# Patient Record
Sex: Male | Born: 1995 | Race: Black or African American | Hispanic: No | Marital: Single
Health system: Southern US, Community
[De-identification: ages and names within clinical notes are randomized; demographics above are authoritative.]

---

## 2020-01-16 ENCOUNTER — Other Ambulatory Visit: Payer: Self-pay

## 2020-01-16 ENCOUNTER — Emergency Department (HOSPITAL_COMMUNITY)
Admission: EM | Admit: 2020-01-16 | Discharge: 2020-01-16 | Disposition: A | Discharge status: Home / Self Care | Payer: Self-pay | Attending: Emergency Medicine | Admitting: Emergency Medicine

## 2020-01-16 ENCOUNTER — Emergency Department (HOSPITAL_COMMUNITY): Payer: Self-pay

## 2020-01-16 ENCOUNTER — Encounter (HOSPITAL_COMMUNITY): Payer: Self-pay | Admitting: Emergency Medicine

## 2020-01-16 DIAGNOSIS — Z20822 Contact with and (suspected) exposure to covid-19: Secondary | ICD-10-CM | POA: Insufficient documentation

## 2020-01-16 DIAGNOSIS — R1010 Upper abdominal pain, unspecified: Secondary | ICD-10-CM | POA: Insufficient documentation

## 2020-01-16 DIAGNOSIS — R112 Nausea with vomiting, unspecified: Secondary | ICD-10-CM | POA: Insufficient documentation

## 2020-01-16 LAB — COMPREHENSIVE METABOLIC PANEL
ALT: 25 U/L (ref 0–44)
AST: 30 U/L (ref 15–41)
Albumin: 5 g/dL (ref 3.5–5.0)
Alkaline Phosphatase: 53 U/L (ref 38–126)
Anion gap: 14 (ref 5–15)
BUN: 11 mg/dL (ref 6–20)
CO2: 21 mmol/L — ABNORMAL LOW (ref 22–32)
Calcium: 10.2 mg/dL (ref 8.9–10.3)
Chloride: 102 mmol/L (ref 98–111)
Creatinine, Ser: 1 mg/dL (ref 0.61–1.24)
GFR calc Af Amer: >60 mL/min (ref >60)
GFR calc non Af Amer: >60 mL/min (ref >60)
Glucose, Bld: 147 mg/dL — ABNORMAL HIGH (ref 70–99)
Potassium: 4 mmol/L (ref 3.5–5.1)
Sodium: 137 mmol/L (ref 135–145)
Total Bilirubin: 0.7 mg/dL (ref 0.3–1.2)
Total Protein: 7.9 g/dL (ref 6.5–8.1)

## 2020-01-16 LAB — CBC
HCT: 44.1 % (ref 39.0–52.0)
Hemoglobin: 14.6 g/dL (ref 13.0–17.0)
MCH: 28.5 pg (ref 26.0–34.0)
MCHC: 33.1 g/dL (ref 30.0–36.0)
MCV: 86.1 fL (ref 80.0–100.0)
Platelets: 164 10*3/uL (ref 150–400)
RBC: 5.12 MIL/uL (ref 4.22–5.81)
RDW: 13.2 % (ref 11.5–15.5)
WBC: 6.9 10*3/uL (ref 4.0–10.5)
nRBC: 0 % (ref 0.0–0.2)

## 2020-01-16 LAB — RESPIRATORY PANEL BY RT PCR (FLU A&B, COVID)
Influenza A by PCR: NEGATIVE
Influenza B by PCR: NEGATIVE
SARS Coronavirus 2 by RT PCR: NEGATIVE

## 2020-01-16 LAB — URINALYSIS, ROUTINE W REFLEX MICROSCOPIC
Bacteria, UA: NONE SEEN
Bilirubin Urine: NEGATIVE
Glucose, UA: NEGATIVE mg/dL
Hgb urine dipstick: NEGATIVE
Ketones, ur: 5 mg/dL — AB
Leukocytes,Ua: NEGATIVE
Nitrite: NEGATIVE
Protein, ur: 30 mg/dL — AB
Specific Gravity, Urine: 1.024 (ref 1.005–1.030)
pH: 8 (ref 5.0–8.0)

## 2020-01-16 LAB — LIPASE, BLOOD: Lipase: 25 U/L (ref 11–51)

## 2020-01-16 MED ORDER — FAMOTIDINE IN NACL 20-0.9 MG/50ML-% IV SOLN
20.0000 mg | Freq: Once | INTRAVENOUS | Status: AC
  Administered 2020-01-16: 20 mg via INTRAVENOUS
  Filled 2020-01-16: qty 50

## 2020-01-16 MED ORDER — IOHEXOL 300 MG/ML  SOLN
80.0000 mL | Freq: Once | INTRAMUSCULAR | Status: AC | PRN
  Administered 2020-01-16: 80 mL via INTRAVENOUS

## 2020-01-16 MED ORDER — ONDANSETRON 4 MG PO TBDP: 4.0000 mg | ORAL_TABLET | Freq: Three times a day (TID) | ORAL | 0 refills | Status: AC | PRN

## 2020-01-16 MED ORDER — SODIUM CHLORIDE 0.9 % IV BOLUS
1000.0000 mL | Freq: Once | INTRAVENOUS | Status: AC
  Administered 2020-01-16: 1000 mL via INTRAVENOUS

## 2020-01-16 MED ORDER — HALOPERIDOL LACTATE 5 MG/ML IJ SOLN
2.0000 mg | Freq: Once | INTRAMUSCULAR | Status: AC
  Administered 2020-01-16: 2 mg via INTRAVENOUS
  Filled 2020-01-16: qty 1

## 2020-01-16 MED ORDER — METOCLOPRAMIDE HCL 5 MG/ML IJ SOLN
10.0000 mg | Freq: Once | INTRAMUSCULAR | Status: AC
  Administered 2020-01-16: 10 mg via INTRAVENOUS
  Filled 2020-01-16: qty 2

## 2020-01-16 MED ORDER — LORAZEPAM 2 MG/ML IJ SOLN
1.0000 mg | Freq: Once | INTRAMUSCULAR | Status: AC
  Administered 2020-01-16: 1 mg via INTRAVENOUS
  Filled 2020-01-16: qty 1

## 2020-01-16 MED ORDER — ONDANSETRON HCL 4 MG/2ML IJ SOLN
4.0000 mg | Freq: Once | INTRAMUSCULAR | Status: AC
  Administered 2020-01-16: 4 mg via INTRAVENOUS
  Filled 2020-01-16: qty 2

## 2020-01-16 MED ORDER — ONDANSETRON 4 MG PO TBDP: 4.0000 mg | ORAL_TABLET | Freq: Three times a day (TID) | ORAL | 0 refills | Status: DC | PRN

## 2020-01-16 NOTE — Discharge Instructions (Addendum)
Take zofran as needed for nausea or vomiting.  Eat a bland diet to decrease symptoms.  Stop smoking marijuana, this is likely contributing to your nausea and vomiting. Follow-up with the GI doctor listed below as needed if you continue to have symptoms. Return to the emergency room if you develop high fevers, persistent vomiting, severe worsening abdominal pain, or any new, worsening, or concerning symptoms.  Your Covid test was negative.  You may return to work when your symptoms are improving.

## 2020-01-16 NOTE — ED Notes (Signed)
Pt nauseous and had small emesis episode.

## 2020-01-16 NOTE — ED Triage Notes (Signed)
Pt reports abd pain that began this morning, reports vomiting as well. States feeling hot and cold.

## 2020-01-16 NOTE — ED Provider Notes (Signed)
[facility] EMERGENCY DEPARTMENT Provider Note   CSN: [id number] Arrival date & time: 01/16/20  4259     History Chief Complaint  Patient presents with  . Abdominal Pain    Joe Patrick is a 24 y.o. male presented for evaluation nausea, vomiting, abdominal pain.  Patient states his symptoms began this morning.  He gradually developed worsening abdominal pain, mostly in the upper abdomen.  He reports associated nausea and vomiting, has thrown up "many" times.  He is unable to quantify.  No blood in his emesis.  He took Pepto-Bismol without improvement of symptoms.  He denies fevers, chills, chest pain, shortness breath, cough, urinary symptoms, normal bowel movements.  He denies history of similar.  No previous history of abdominal problems.  He has never had abdominal surgeries.  He has never seen GI.  He denies alcohol use.  He reports heavy/daily marijuana use.  He has never had issues with hyperemesis cannabinoid syndrome. He has no medical problems, takes no medications daily. No one else around him is sick.   HPI     History reviewed. No pertinent past medical history.  There are no problems to display for this patient.   History reviewed. No pertinent surgical history.     No family history on file.  Social History   Tobacco Use  . Smoking status: Not on file  Substance Use Topics  . Alcohol use: Not on file  . Drug use: Not on file    Home Medications Prior to Admission medications   Medication Sig Start Date End Date Taking? Authorizing Provider  ondansetron (ZOFRAN ODT) 4 MG disintegrating tablet Take 1 tablet (4 mg total) by mouth every 8 (eight) hours as needed for nausea or vomiting. 01/16/20   Birch Farino, PA-C    Allergies    Patient has no known allergies.  Review of Systems   Review of Systems  Gastrointestinal: Positive for abdominal pain, nausea and vomiting.  All other systems reviewed and are negative.   Physical  Exam Updated Vital Signs BP (!) 125/55 (BP Location: Left Arm)   Pulse 71   Temp 99.2 F (37.3 C) (Oral)   Resp 16   Ht 6' (1.829 m)   Wt 68 kg   SpO2 100%   BMI 20.34 kg/m   Physical Exam Vitals and nursing note reviewed.  Constitutional:      General: He is not in acute distress.    Appearance: He is well-developed.     Comments: Appears nontoxic  HENT:     Head: Normocephalic and atraumatic.  Eyes:     Conjunctiva/sclera: Conjunctivae normal.     Pupils: Pupils are equal, round, and reactive to light.  Cardiovascular:     Rate and Rhythm: Normal rate and regular rhythm.     Pulses: Normal pulses.  Pulmonary:     Effort: Pulmonary effort is normal. No respiratory distress.     Breath sounds: Normal breath sounds. No wheezing.  Abdominal:     General: There is no distension.     Palpations: Abdomen is soft. There is no mass.     Tenderness: There is abdominal tenderness. There is no guarding or rebound.     Comments: TTP of upper abdomen, worse in epigastric region.  No rigidity, guarding, distention.  Negative rebound.  No peritonitis.  Musculoskeletal:        General: Normal range of motion.     Cervical back: Normal range of motion and neck supple.  Skin:    General: Skin is warm and dry.     Capillary Refill: Capillary refill takes less than 2 seconds.  Neurological:     Mental Status: He is alert and oriented to person, place, and time.     ED Results / Procedures / Treatments   Labs (all labs ordered are listed, but only abnormal results are displayed) Labs Reviewed  COMPREHENSIVE METABOLIC PANEL - Abnormal; Notable for the following components:      Result Value   CO2 21 (*)    Glucose, Bld 147 (*)    All other components within normal limits  URINALYSIS, ROUTINE W REFLEX MICROSCOPIC - Abnormal; Notable for the following components:   Ketones, ur 5 (*)    Protein, ur 30 (*)    All other components within normal limits  RESPIRATORY PANEL BY RT PCR  (FLU A&B, COVID)  LIPASE, BLOOD  CBC    EKG EKG Interpretation  Date/Time:  Friday January 16 2020 14:10:34 EDT Ventricular Rate:  74 PR Interval:    QRS Duration: 93 QT Interval:  531 QTC Calculation: 590 R Axis:   92 Text Interpretation: Sinus rhythm Borderline short PR interval Borderline right axis deviation Prolonged QT interval Confirmed by Virgina Norfolk [phone]) on 01/16/2020 2:14:23 PM   Radiology CT ABDOMEN PELVIS W CONTRAST  Result Date: 01/16/2020 CLINICAL DATA:  Nausea and vomiting. EXAM: CT ABDOMEN AND PELVIS WITH CONTRAST TECHNIQUE: Multidetector CT imaging of the abdomen and pelvis was performed using the standard protocol following bolus administration of intravenous contrast. CONTRAST:  57mL OMNIPAQUE IOHEXOL 300 MG/ML  SOLN COMPARISON:  None. FINDINGS: Lower chest: Insert lung bases Hepatobiliary: No focal hepatic lesions or intrahepatic biliary dilatation. The gallbladder is normal. No common bile duct dilatation. Pancreas: No mass, inflammation or ductal dilatation. Spleen: Normal size.  No focal lesions. Adrenals/Urinary Tract: The adrenal glands are normal. Very small low-attenuation lesion in the lower pole region right kidney likely a benign cyst. No worrisome renal lesions or CT findings to suggest pyelonephritis. No renal or obstructing ureteral calculi. The bladder is unremarkable. Stomach/Bowel: The stomach, duodenum, small bowel and colon are grossly normal without oral contrast. No inflammatory changes, mass lesions or obstructive findings. The appendix is normal. Low lying cecum deep in the pelvis containing moderate stool. Vascular/Lymphatic: The aorta is normal in caliber. No dissection. The branch vessels are patent. The major venous structures are patent. No mesenteric or retroperitoneal mass or adenopathy. Small scattered lymph nodes are noted. Reproductive: The prostate gland and seminal vesicles are unremarkable. Other: No pelvic mass or adenopathy. No free  pelvic fluid collections. No inguinal mass or adenopathy. No abdominal wall hernia or subcutaneous lesions. Musculoskeletal: No significant bony findings. IMPRESSION: Unremarkable abdominal/pelvic CT scan. No acute abdominal/pelvic findings, mass lesions or adenopathy. Electronically Signed   By: Rudie Meyer M.D.   On: 01/16/2020 15:36    Procedures Procedures (including critical care time)  Medications Ordered in ED Medications  ondansetron (ZOFRAN) injection 4 mg (4 mg Intravenous Given 01/16/20 1056)  sodium chloride 0.9 % bolus 1,000 mL (0 mLs Intravenous Stopped 01/16/20 1224)  famotidine (PEPCID) IVPB 20 mg premix (0 mg Intravenous Stopped 01/16/20 1157)  metoCLOPramide (REGLAN) injection 10 mg (10 mg Intravenous Given 01/16/20 1221)  haloperidol lactate (HALDOL) injection 2 mg (2 mg Intravenous Given 01/16/20 1330)  sodium chloride 0.9 % bolus 1,000 mL (0 mLs Intravenous Stopped 01/16/20 1654)  iohexol (OMNIPAQUE) 300 MG/ML solution 80 mL (80 mLs Intravenous Contrast Given 01/16/20 1500)  LORazepam (ATIVAN) injection 1 mg (1 mg Intravenous Given 01/16/20 1603)    ED Course  I have reviewed the triage vital signs and the nursing notes.  Pertinent labs & imaging results that were available during my care of the patient were reviewed by me and considered in my medical decision making (see chart for details).    MDM Rules/Calculators/A&P                          Patient presented for evaluation nausea, vomiting, Donnell pain.  On exam, patient appears nontoxic.  Vital signs are normal, no fever.  Abdominal exam is overall reassuring, with mild tenderness palpation of the upper abdomen.  Consider pancreatitis.  Consider gastritis.  Consider hyperemesis cannabinoid syndrome.  Will obtain labs, treat symptomatically, and reassess.  Labs interpreted by me, overall reassuring.  No acidosis.  Lipase normal.  Electrolytes stable.  Patient continues to have nausea and abdominal pain, Reglan  given.  Continues to have abdominal pain, Haldol given.  Will obtain CT due to patient's continued pain and nausea. EKG ordered due to multiple qt prolonging drugs.  EKG showed slightly long qtc, 590. Dr. Lockie Mola reviewed the ekg. Will hold on further qt prolonging medications.   CT abdomen pelvis negative for acute findings.  Patient did have 1 more episode of emesis, Ativan given.  If he continues to have nausea, vomiting, abdominal pain after this, he will likely need to be admitted.  On reassessment, patient reports improvement of nausea vomiting.  No further abdominal pain.  I discussed that at this time, that does not appear to be a life-threatening illness requiring hospitalization or surgery.  Recommend patient stop using marijuana, as this is likely causing his symptoms.  Also consider viral illness.  Will have patient treat symptomatically with Zofran, follow-up with GI as needed.  At this time, patient appears safe for discharge.  Return precautions given.  Patient states he understands and agrees to plan.  Final Clinical Impression(s) / ED Diagnoses Final diagnoses:  Non-intractable vomiting with nausea, unspecified vomiting type  Upper abdominal pain    Rx / DC Orders ED Discharge Orders         Ordered    ondansetron (ZOFRAN ODT) 4 MG disintegrating tablet  Every 8 hours PRN,   Status:  Discontinued        01/16/20 1628    ondansetron (ZOFRAN ODT) 4 MG disintegrating tablet  Every 8 hours PRN        01/16/20 1643           Cambry Spampinato, PA-C 01/16/20 1709    Curatolo, Adam, DO 01/19/20 0710

## 2020-01-16 NOTE — ED Notes (Signed)
Pt ambulated to the bathroom with a steady gait.

## 2020-01-16 NOTE — ED Notes (Signed)
Pt PO challenged with water. Will reassess in 30 minutes.

## 2021-05-26 IMAGING — CT CT ABD-PELV W/ CM
2 of 4 series · 16 of 46 positions shown, 18 images · IV contrast (omnipaque)
Comparison: None.

CLINICAL DATA: Nausea and vomiting.

EXAM:
CT ABDOMEN AND PELVIS WITH CONTRAST
TECHNIQUE: Multidetector CT imaging of the abdomen and pelvis was performed
using the standard protocol following bolus administration of
intravenous contrast.
CONTRAST:  80mL OMNIPAQUE IOHEXOL 300 MG/ML  SOLN

[Series 3: abd/ pelvis 5.0 i30f 2 · axial · 0.72mm/px · z∈[+742,+1182]mm · 13 of 96 slices shown, 15 images]
[im 4/96  soft-tissue]
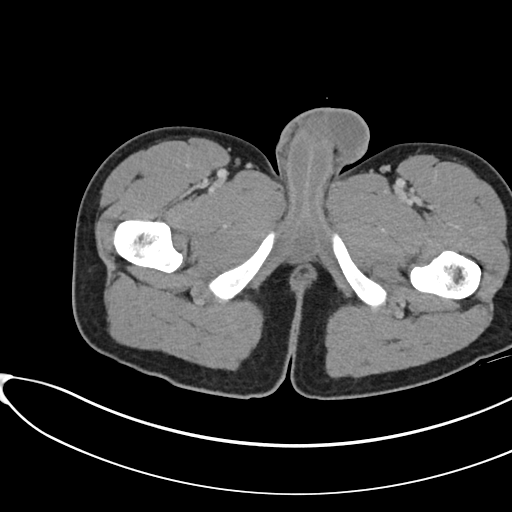
[im 4/96  bone]
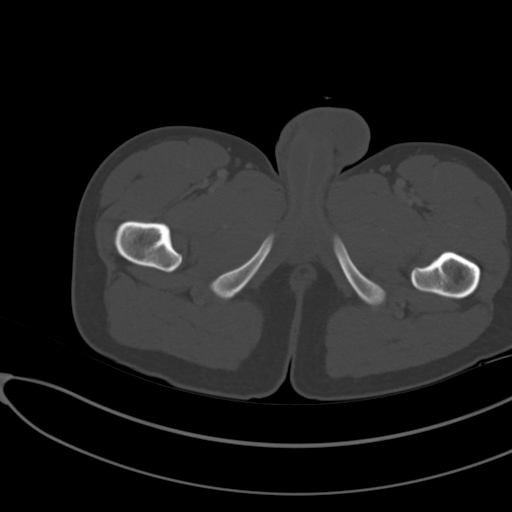
[im 12/96  soft-tissue]
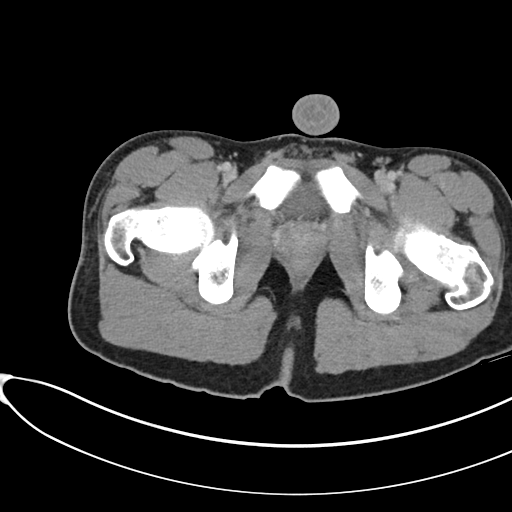
[im 20/96  soft-tissue]
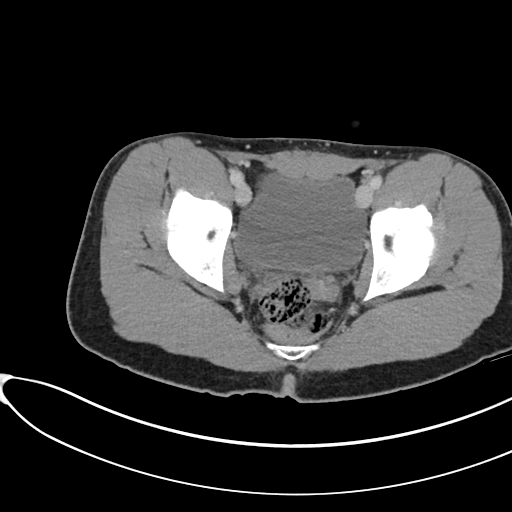
[im 28/96  soft-tissue]
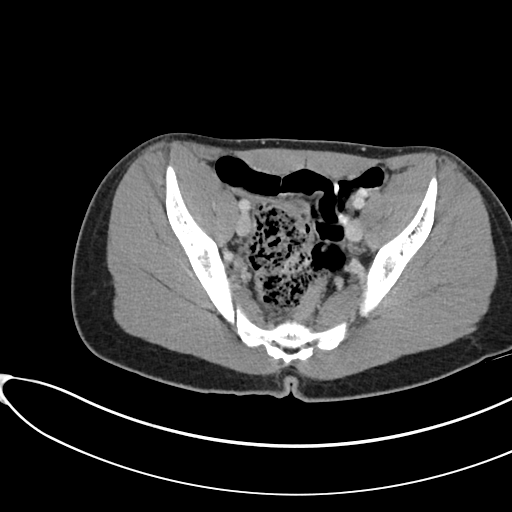
[im 32/96  soft-tissue]
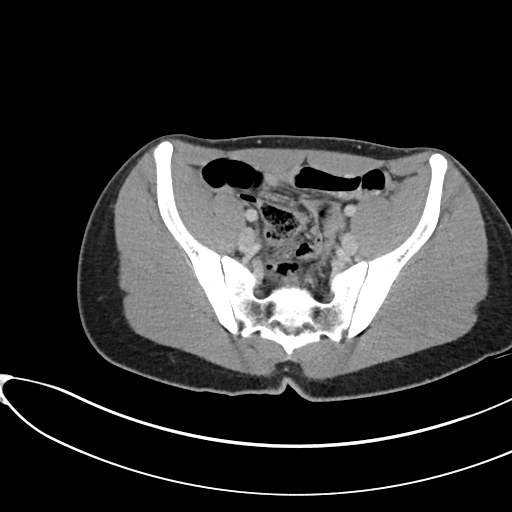
[im 40/96  soft-tissue]
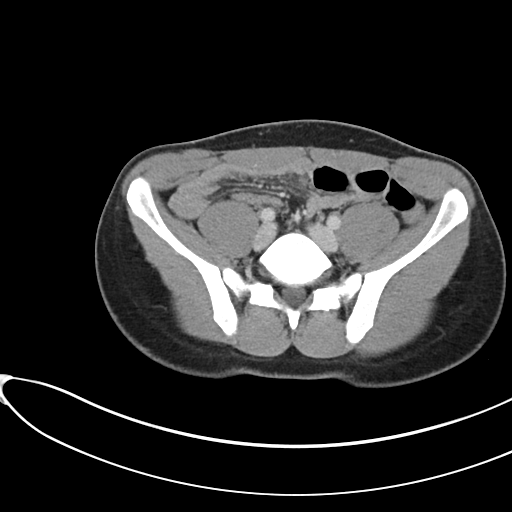
[im 48/96  soft-tissue]
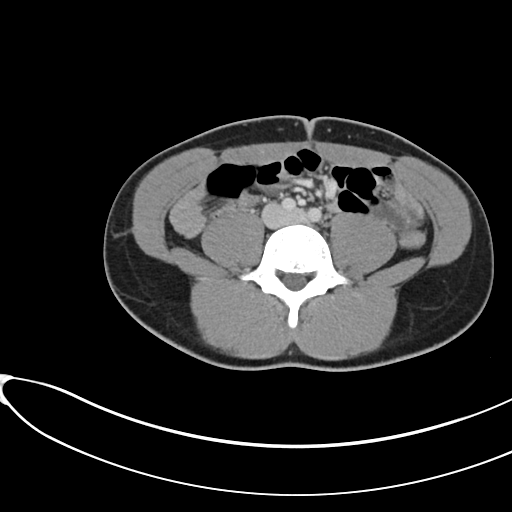
[im 56/96  soft-tissue]
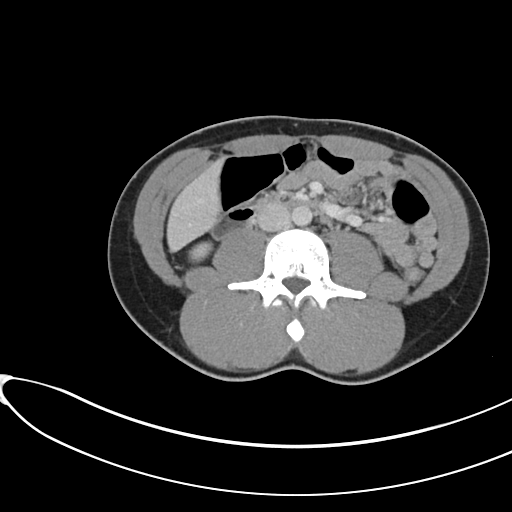
[im 64/96  soft-tissue]
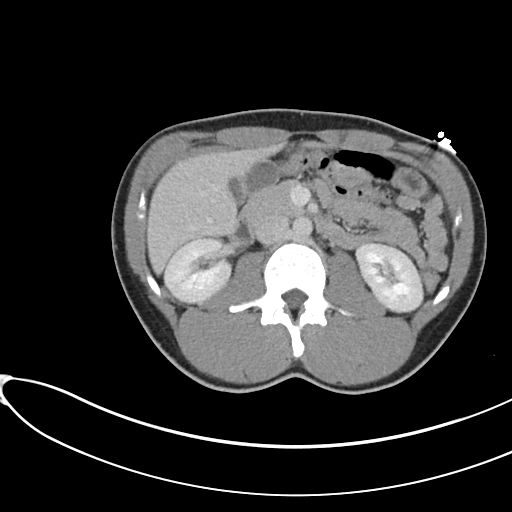
[im 64/96  bone]
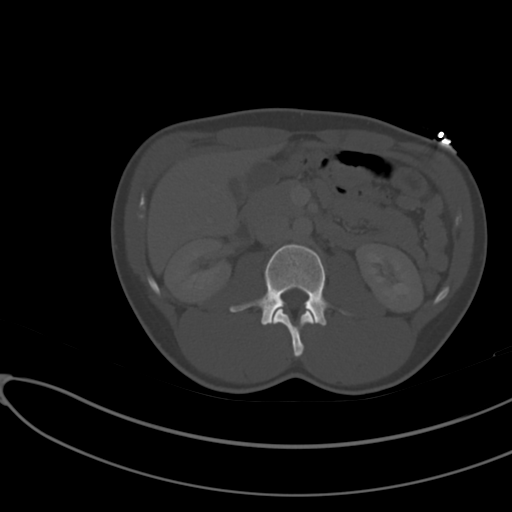
[im 68/96  soft-tissue]
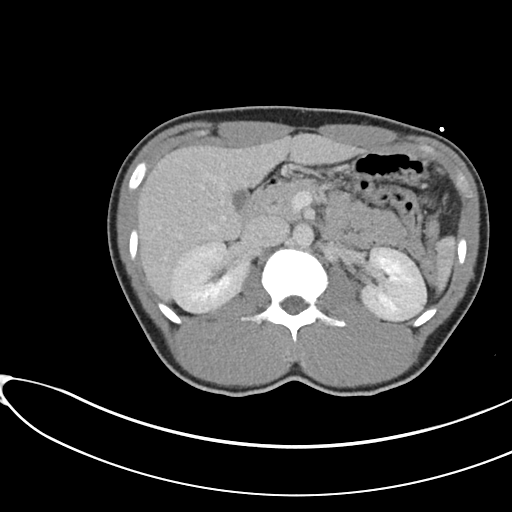
[im 76/96  soft-tissue]
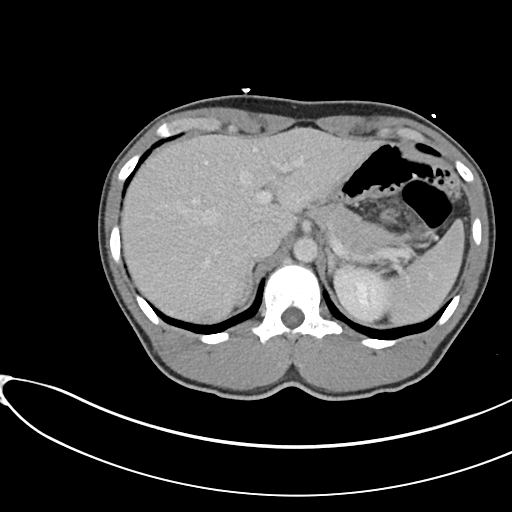
[im 84/96  soft-tissue]
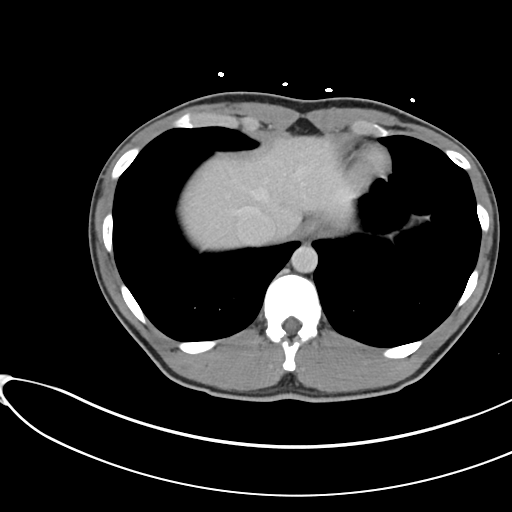
[im 92/96  soft-tissue]
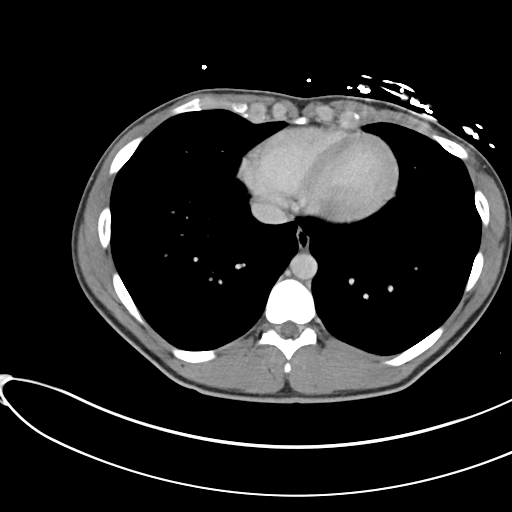

[Series 6: coronal soft tissue · coronal · 0.71mm/px · 3 of 100 slices shown]
[im 34/100  soft-tissue]
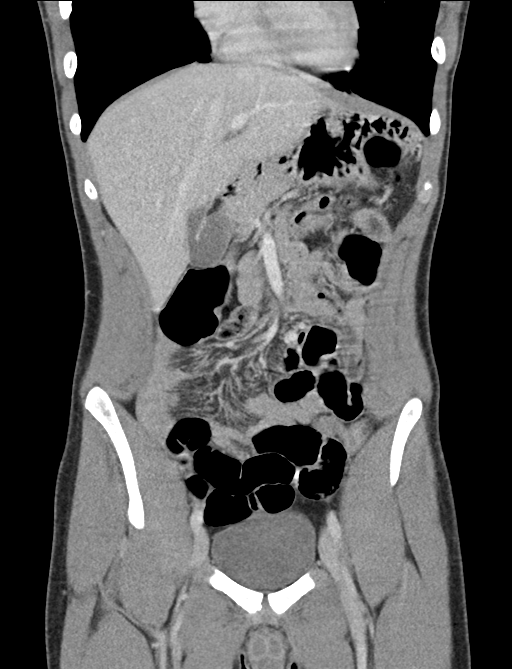
[im 45/100  soft-tissue]
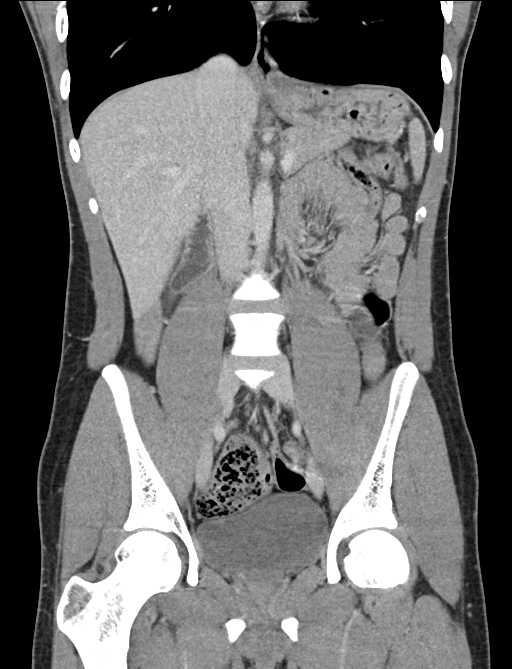
[im 56/100  soft-tissue]
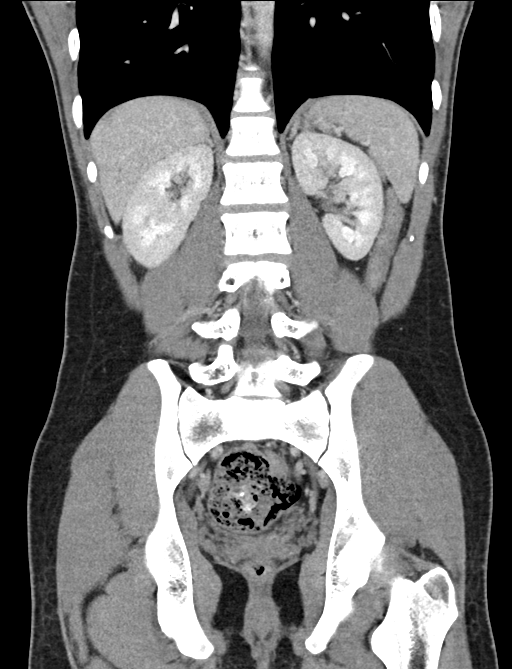

[16 of 46 positions shown; findings below may reference images not displayed]

FINDINGS: Lower chest: Insert lung bases

Hepatobiliary: No focal hepatic lesions or intrahepatic biliary
dilatation. The gallbladder is normal. No common bile duct
dilatation.

Pancreas: No mass, inflammation or ductal dilatation.

Spleen: Normal size.  No focal lesions.

Adrenals/Urinary Tract: The adrenal glands are normal.

Very small low-attenuation lesion in the lower pole region right
kidney likely a benign cyst. No worrisome renal lesions or CT
findings to suggest pyelonephritis. No renal or obstructing ureteral
calculi. The bladder is unremarkable.

Stomach/Bowel: The stomach, duodenum, small bowel and colon are
grossly normal without oral contrast. No inflammatory changes, mass
lesions or obstructive findings. The appendix is normal. Low lying
cecum deep in the pelvis containing moderate stool.

Vascular/Lymphatic: The aorta is normal in caliber. No dissection.
The branch vessels are patent. The major venous structures are
patent. No mesenteric or retroperitoneal mass or adenopathy. Small
scattered lymph nodes are noted.

Reproductive: The prostate gland and seminal vesicles are
unremarkable.

Other: No pelvic mass or adenopathy. No free pelvic fluid
collections. No inguinal mass or adenopathy. No abdominal wall
hernia or subcutaneous lesions.

Musculoskeletal: No significant bony findings.
IMPRESSION: Unremarkable abdominal/pelvic CT scan. No acute abdominal/pelvic
findings, mass lesions or adenopathy.
